# Patient Record
Sex: Female | Born: 1945
Health system: Southern US, Community
[De-identification: ages and names within clinical notes are randomized; demographics above are authoritative.]

## PROBLEM LIST (undated history)

## (undated) DIAGNOSIS — E7800 Pure hypercholesterolemia, unspecified: Secondary | ICD-10-CM

## (undated) DIAGNOSIS — I10 Essential (primary) hypertension: Secondary | ICD-10-CM

## (undated) DIAGNOSIS — F32A Depression, unspecified: Secondary | ICD-10-CM

## (undated) DIAGNOSIS — F329 Major depressive disorder, single episode, unspecified: Secondary | ICD-10-CM

## (undated) HISTORY — PX: BACK SURGERY: SHX140

## (undated) HISTORY — PX: REPLACEMENT TOTAL KNEE: SUR1224

---
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
## 1997-08-13 ENCOUNTER — Ambulatory Visit (HOSPITAL_COMMUNITY): Admission: RE | Admit: 1997-08-13 | Discharge: 1997-08-13 | Payer: Self-pay | Admitting: Orthopedic Surgery

## 1997-10-31 ENCOUNTER — Emergency Department (HOSPITAL_COMMUNITY): Admission: EM | Admit: 1997-10-31 | Discharge: 1997-10-31 | Payer: Self-pay | Admitting: Emergency Medicine

## 2001-02-25 ENCOUNTER — Ambulatory Visit (HOSPITAL_BASED_OUTPATIENT_CLINIC_OR_DEPARTMENT_OTHER): Admission: RE | Admit: 2001-02-25 | Discharge: 2001-02-25 | Payer: Self-pay | Admitting: Orthopedic Surgery

## 2001-04-19 ENCOUNTER — Ambulatory Visit (HOSPITAL_COMMUNITY): Admission: RE | Admit: 2001-04-19 | Discharge: 2001-04-19 | Payer: Self-pay | Admitting: Orthopedic Surgery

## 2003-09-12 ENCOUNTER — Emergency Department (HOSPITAL_COMMUNITY): Admission: EM | Admit: 2003-09-12 | Discharge: 2003-09-12 | Payer: Self-pay | Admitting: Emergency Medicine

## 2003-10-17 ENCOUNTER — Emergency Department (HOSPITAL_COMMUNITY): Admission: EM | Admit: 2003-10-17 | Discharge: 2003-10-17 | Payer: Self-pay | Admitting: Emergency Medicine

## 2003-11-06 ENCOUNTER — Emergency Department (HOSPITAL_COMMUNITY): Admission: EM | Admit: 2003-11-06 | Discharge: 2003-11-07 | Payer: Self-pay | Admitting: Emergency Medicine

## 2003-12-13 ENCOUNTER — Emergency Department (HOSPITAL_COMMUNITY): Admission: EM | Admit: 2003-12-13 | Discharge: 2003-12-13 | Payer: Self-pay | Admitting: Emergency Medicine

## 2005-08-10 ENCOUNTER — Emergency Department (HOSPITAL_COMMUNITY): Admission: EM | Admit: 2005-08-10 | Discharge: 2005-08-10 | Payer: Self-pay | Admitting: Emergency Medicine

## 2018-10-15 ENCOUNTER — Emergency Department (HOSPITAL_BASED_OUTPATIENT_CLINIC_OR_DEPARTMENT_OTHER): Payer: Medicare HMO

## 2018-10-15 ENCOUNTER — Other Ambulatory Visit: Payer: Self-pay

## 2018-10-15 ENCOUNTER — Emergency Department (HOSPITAL_BASED_OUTPATIENT_CLINIC_OR_DEPARTMENT_OTHER)
Admission: EM | Admit: 2018-10-15 | Discharge: 2018-10-15 | Disposition: A | Discharge status: Home / Self Care | Payer: Medicare HMO | Attending: Emergency Medicine | Admitting: Emergency Medicine

## 2018-10-15 ENCOUNTER — Encounter (HOSPITAL_BASED_OUTPATIENT_CLINIC_OR_DEPARTMENT_OTHER): Payer: Self-pay | Admitting: Emergency Medicine

## 2018-10-15 DIAGNOSIS — I10 Essential (primary) hypertension: Secondary | ICD-10-CM | POA: Diagnosis not present

## 2018-10-15 DIAGNOSIS — E785 Hyperlipidemia, unspecified: Secondary | ICD-10-CM | POA: Diagnosis not present

## 2018-10-15 DIAGNOSIS — W010XXA Fall on same level from slipping, tripping and stumbling without subsequent striking against object, initial encounter: Secondary | ICD-10-CM | POA: Insufficient documentation

## 2018-10-15 DIAGNOSIS — Y9389 Activity, other specified: Secondary | ICD-10-CM | POA: Diagnosis not present

## 2018-10-15 DIAGNOSIS — Y92810 Car as the place of occurrence of the external cause: Secondary | ICD-10-CM | POA: Diagnosis not present

## 2018-10-15 DIAGNOSIS — Y999 Unspecified external cause status: Secondary | ICD-10-CM | POA: Insufficient documentation

## 2018-10-15 DIAGNOSIS — Z720 Tobacco use: Secondary | ICD-10-CM | POA: Diagnosis not present

## 2018-10-15 DIAGNOSIS — S82092A Other fracture of left patella, initial encounter for closed fracture: Secondary | ICD-10-CM | POA: Insufficient documentation

## 2018-10-15 DIAGNOSIS — S8992XA Unspecified injury of left lower leg, initial encounter: Secondary | ICD-10-CM | POA: Diagnosis present

## 2018-10-15 HISTORY — DX: Depression, unspecified: F32.A

## 2018-10-15 HISTORY — DX: Essential (primary) hypertension: I10

## 2018-10-15 HISTORY — DX: Pure hypercholesterolemia, unspecified: E78.00

## 2018-10-15 MED ORDER — HYDROMORPHONE HCL 1 MG/ML IJ SOLN
1.0000 mg | Freq: Once | INTRAMUSCULAR | Status: AC
  Administered 2018-10-15: 1 mg via INTRAMUSCULAR
  Filled 2018-10-15: qty 1

## 2018-10-15 MED ORDER — MORPHINE SULFATE (PF) 4 MG/ML IV SOLN
4.0000 mg | Freq: Once | INTRAVENOUS | Status: AC
  Administered 2018-10-15: 4 mg via INTRAMUSCULAR
  Filled 2018-10-15: qty 1

## 2018-10-15 MED ORDER — HYDROMORPHONE HCL 1 MG/ML IJ SOLN
0.5000 mg | Freq: Once | INTRAMUSCULAR | Status: AC
  Administered 2018-10-15: 0.5 mg via INTRAMUSCULAR
  Filled 2018-10-15: qty 1

## 2018-10-15 MED ORDER — ONDANSETRON 4 MG PO TBDP
4.0000 mg | ORAL_TABLET | Freq: Once | ORAL | Status: AC
  Administered 2018-10-15: 12:00:00 4 mg via ORAL
  Filled 2018-10-15: qty 1

## 2018-10-15 MED ORDER — OXYCODONE-ACETAMINOPHEN 5-325 MG PO TABS: 1.0000 | ORAL_TABLET | ORAL | 0 refills | Status: DC | PRN

## 2018-10-15 MED FILL — OXYCODONE-ACETAMINOPHEN 5-3: 5-325 | 3 days supply | Qty: 15 | Fill #0

## 2018-10-15 NOTE — ED Triage Notes (Signed)
Tripped and fell last evening getting out of the car.  Injury to left knee.  Pain radiating to upper and lower leg. Not able to bear weight.  No other injury from the fall.

## 2018-10-15 NOTE — ED Notes (Signed)
Walked Pt. With walker but Pt. Unable to walk one step.  Pt. Will be given script for walker.

## 2018-10-15 NOTE — ED Provider Notes (Signed)
[facility] EMERGENCY DEPARTMENT Provider Note   CSN: [id number] Arrival date & time: 10/15/18  1131     History   Chief Complaint Chief Complaint  Patient presents with  . Knee Injury    HPI Kirsten Watson is a 73 y.o. female.     Pt presents to the ED today with left knee pain.  She said she fell last evening trying to get out of the car.  She landed on her knee.  Her leg below and above her knee also hurts, but no other injuries.  She did not hit her head or have a loc.  She has not been able to ambulate on it.     Past Medical History:  Diagnosis Date  . Depression   . High cholesterol   . Hypertension     There are no active problems to display for this patient.   Past Surgical History:  Procedure Laterality Date  . BACK SURGERY    . REPLACEMENT TOTAL KNEE       OB History   No obstetric history on file.      Home Medications    Prior to Admission medications   Medication Sig Start Date End Date Taking? Authorizing Provider  oxyCODONE-acetaminophen (PERCOCET/ROXICET) 5-325 MG tablet Take 1 tablet by mouth every 4 (four) hours as needed for severe pain. 10/15/18   Jacalyn Lefevre, MD    Family History No family history on file.  Social History Social History   Tobacco Use  . Smoking status: Current Some Day Smoker  . Smokeless tobacco: Never Used  Substance Use Topics  . Alcohol use: Never    Frequency: Never  . Drug use: Never     Allergies   Tramadol   Review of Systems Review of Systems  Musculoskeletal:       Left knee pain  All other systems reviewed and are negative.    Physical Exam Updated Vital Signs BP (!) 96/58 (BP Location: Right Arm)   Pulse 73   Temp 98.4 F (36.9 C) (Oral)   Resp 16   Ht 5' 9.5" (1.765 m)   Wt 79.3 kg   SpO2 95%   BMI 25.46 kg/m   Physical Exam Vitals signs and nursing note reviewed.  Constitutional:      Appearance: Normal appearance.  HENT:     Head: Normocephalic and  atraumatic.     Right Ear: External ear normal.     Left Ear: External ear normal.     Nose: Nose normal.     Mouth/Throat:     Mouth: Mucous membranes are moist.     Pharynx: Oropharynx is clear.  Eyes:     Extraocular Movements: Extraocular movements intact.     Conjunctiva/sclera: Conjunctivae normal.     Pupils: Pupils are equal, round, and reactive to light.  Neck:     Musculoskeletal: Normal range of motion and neck supple.  Cardiovascular:     Rate and Rhythm: Normal rate and regular rhythm.     Pulses: Normal pulses.     Heart sounds: Normal heart sounds.  Pulmonary:     Effort: Pulmonary effort is normal.     Breath sounds: Normal breath sounds.  Abdominal:     General: Abdomen is flat. Bowel sounds are normal.     Palpations: Abdomen is soft.  Musculoskeletal:     Left knee: She exhibits decreased range of motion, swelling and effusion.  Skin:    General: Skin is warm.  Capillary Refill: Capillary refill takes less than 2 seconds.  Neurological:     General: No focal deficit present.     Mental Status: She is alert and oriented to person, place, and time.  Psychiatric:        Mood and Affect: Mood normal.        Behavior: Behavior normal.      ED Treatments / Results  Labs (all labs ordered are listed, but only abnormal results are displayed) Labs Reviewed - No data to display  EKG None  Radiology Dg Tibia/fibula Left  Result Date: 10/15/2018 CLINICAL DATA:  Fall last evening getting out of a car, injury to LEFT knee, pain radiating to upper and lower leg, unable to bear weight EXAM: LEFT TIBIA AND FIBULA - 2 VIEW COMPARISON:  None FINDINGS: Knee imaged and reported separately. Osseous demineralization. Obliquity at ankle. No acute fracture, dislocation, or bone destruction. Scattered atherosclerotic calcifications. IMPRESSION: No acute osseous abnormalities. Please refer to LEFT knee radiographs, imaged and reported separately. Electronically Signed    By: Lavonia Dana M.D.   On: 10/15/2018 12:19   Ct Knee Left Wo Contrast  Result Date: 10/15/2018 CLINICAL DATA:  The patient suffered a left knee injury due to a trip and fall getting out of a car last night. Pain. Initial encounter. EXAM: CT OF THE LEFT KNEE WITHOUT CONTRAST TECHNIQUE: Multidetector CT imaging of the left knee was performed according to the standard protocol. Multiplanar CT image reconstructions were also generated. COMPARISON:  Plain films of the left knee today. FINDINGS: Bones/Joint/Cartilage The patient has a nondisplaced fracture of the patella. The fracture originates in the medial facet superiorly and extends in an anterior and inferior orientation through the inferior pole of the lateral facet. No other fracture is identified. Bones are osteopenic. Joint space narrowing and osteophytosis about the knee are worst in the lateral compartment. Hemarthrosis is identified. Ligaments Suboptimally assessed by CT. Muscles and Tendons Appear intact. Soft tissues None. IMPRESSION: Acute nondisplaced fracture of the patella as described above. No other acute bony abnormality is identified. Associated hemarthrosis noted. Osteoarthritis about the knee is worst in the lateral compartment. Electronically Signed   By: Inge Rise M.D.   On: 10/15/2018 13:26   Dg Knee Complete 4 Views Left  Result Date: 10/15/2018 CLINICAL DATA:  Fall. EXAM: LEFT KNEE - COMPLETE 4+ VIEW COMPARISON:  No recent prior. FINDINGS: Large knee joint effusion. Severe tricompartment degenerative change. Severe tricompartment degenerative changes most prominent about the lateral compartment. A small bony density posterior patella cannot be excluded. This could represent small fracture chips. This could represent small loose bodies. Similar finding noted along the anterior tibia. No evidence of dislocation. Peripheral vascular calcification. IMPRESSION: 1. Large knee joint effusion. Severe tricompartment degenerative  changes, most prominent about the lateral compartment. Tiny bony densities noted posterior to the patella and lung the anterior aspect of the tibia. These could represent small fracture chips. These could also represent small loose bodies. No other evidence of fracture. No dislocation. 2. Peripheral vascular disease. Electronically Signed   By: Marcello Moores  Register   On: 10/15/2018 12:18   Dg Femur Min 2 Views Left  Result Date: 10/15/2018 CLINICAL DATA:  Fall last evening getting out of a car, injury to LEFT knee, pain radiating to upper and lower leg, unable to bear weight EXAM: LEFT FEMUR 2 VIEWS COMPARISON:  None FINDINGS: Knee exclude, imaged and reported separately. Osseous demineralization. Hip joint space preserved. No acute fracture, dislocation or bone destruction.  LEFT knee joint effusion suspected. Scattered atherosclerotic calcifications. IMPRESSION: No acute osseous abnormalities. Suspected LEFT knee joint effusion. Knee reported separately. Electronically Signed   By: Ulyses SouthwardMark  Boles M.D.   On: 10/15/2018 12:17    Procedures Procedures (including critical care time)  Medications Ordered in ED Medications  HYDROmorphone (DILAUDID) injection 0.5 mg (has no administration in time range)  morphine 4 MG/ML injection 4 mg (4 mg Intramuscular Given 10/15/18 1153)  ondansetron (ZOFRAN-ODT) disintegrating tablet 4 mg (4 mg Oral Given 10/15/18 1152)  HYDROmorphone (DILAUDID) injection 1 mg (1 mg Intramuscular Given 10/15/18 1244)     Initial Impression / Assessment and Plan / ED Course  I have reviewed the triage vital signs and the nursing notes.  Pertinent labs & imaging results that were available during my care of the patient were reviewed by me and considered in my medical decision making (see chart for details).       Pt placed in a knee immobilizer.  She has a walker at home, but it does not have a functional brake.  I ordered another one for her to pick up at a medical supply store.  She  does have family that can help care for her at home.  She is instructed to f/u with ortho.  Return if worse.  Final Clinical Impressions(s) / ED Diagnoses   Final diagnoses:  Other closed fracture of left patella, initial encounter    ED Discharge Orders         Ordered    oxyCODONE-acetaminophen (PERCOCET/ROXICET) 5-325 MG tablet  Every 4 hours PRN     10/15/18 1359    For home use only DME 4 wheeled rolling walker with seat     10/15/18 1359           Jacalyn LefevreHaviland, Amey Hossain, MD 10/15/18 1401

## 2018-10-15 NOTE — ED Notes (Signed)
ED Provider at bedside. 

## 2018-10-26 ENCOUNTER — Emergency Department (HOSPITAL_BASED_OUTPATIENT_CLINIC_OR_DEPARTMENT_OTHER)
Admission: EM | Admit: 2018-10-26 | Discharge: 2018-10-26 | Disposition: A | Discharge status: Home / Self Care | Payer: Medicare HMO | Attending: Emergency Medicine | Admitting: Emergency Medicine

## 2018-10-26 ENCOUNTER — Emergency Department (HOSPITAL_BASED_OUTPATIENT_CLINIC_OR_DEPARTMENT_OTHER): Payer: Medicare HMO

## 2018-10-26 ENCOUNTER — Other Ambulatory Visit: Payer: Self-pay

## 2018-10-26 ENCOUNTER — Encounter (HOSPITAL_BASED_OUTPATIENT_CLINIC_OR_DEPARTMENT_OTHER): Payer: Self-pay | Admitting: *Deleted

## 2018-10-26 DIAGNOSIS — S82092D Other fracture of left patella, subsequent encounter for closed fracture with routine healing: Secondary | ICD-10-CM | POA: Insufficient documentation

## 2018-10-26 DIAGNOSIS — Z79899 Other long term (current) drug therapy: Secondary | ICD-10-CM | POA: Diagnosis not present

## 2018-10-26 DIAGNOSIS — M7122 Synovial cyst of popliteal space [Baker], left knee: Secondary | ICD-10-CM | POA: Insufficient documentation

## 2018-10-26 DIAGNOSIS — I10 Essential (primary) hypertension: Secondary | ICD-10-CM | POA: Diagnosis not present

## 2018-10-26 DIAGNOSIS — M79662 Pain in left lower leg: Secondary | ICD-10-CM | POA: Insufficient documentation

## 2018-10-26 DIAGNOSIS — M545 Low back pain, unspecified: Secondary | ICD-10-CM

## 2018-10-26 DIAGNOSIS — M25562 Pain in left knee: Secondary | ICD-10-CM | POA: Diagnosis present

## 2018-10-26 DIAGNOSIS — M25462 Effusion, left knee: Secondary | ICD-10-CM

## 2018-10-26 DIAGNOSIS — X58XXXD Exposure to other specified factors, subsequent encounter: Secondary | ICD-10-CM | POA: Insufficient documentation

## 2018-10-26 DIAGNOSIS — F17210 Nicotine dependence, cigarettes, uncomplicated: Secondary | ICD-10-CM | POA: Insufficient documentation

## 2018-10-26 MED ORDER — OXYCODONE-ACETAMINOPHEN 5-325 MG PO TABS: 1.0000 | ORAL_TABLET | Freq: Four times a day (QID) | ORAL | 0 refills | Status: AC | PRN

## 2018-10-26 NOTE — ED Notes (Signed)
Educated on use of knee immobilizer per md request to use after swelling decreases.  Ace wrap applied.  Tolerated well.  Positive pulses noted distal to ace wrap.

## 2018-10-26 NOTE — Discharge Instructions (Addendum)
You do not have a blood clot in your leg today, but you do have a cyst on the back of your knee.  This should improve on its own, but your orthopedic doctor may end up recommending some treatment for it as well.  Take Percocet as prescribed, as needed for severe pain.  Use Ace wrap until you are able to tolerate knee immobilizer.  Elevate and use ice 3-4 times daily alternating 20 minutes on, 20 minutes off.  It is importantly follow-up with orthopedic doctor below.  Please return to the emergency department if you develop any new or worsening symptoms.  Do not drink alcohol, drive, operate machinery or participate in any other potentially dangerous activities while taking opiate pain medication as it may make you sleepy. Do not take this medication with any other sedating medications, either prescription or over-the-counter. If you were prescribed Percocet or Vicodin, do not take these with acetaminophen (Tylenol) as it is already contained within these medications and overdose of Tylenol is dangerous.   This medication is an opiate (or narcotic) pain medication and can be habit forming.  Use it as little as possible to achieve adequate pain control.  Do not use or use it with extreme caution if you have a history of opiate abuse or dependence. This medication is intended for your use only - do not give any to anyone else and keep it in a secure place where nobody else, especially children, have access to it. It will also cause or worsen constipation, so you may want to consider taking an over-the-counter stool softener while you are taking this medication.

## 2018-10-26 NOTE — ED Provider Notes (Signed)
[facility] EMERGENCY DEPARTMENT Provider Note   CSN: [id number] Arrival date & time: 10/26/18  1609     History   Chief Complaint Chief Complaint  Patient presents with  . Knee Pain    HPI Kirsten Watson is a 73 y.o. female with history of hypertension, hypercholesterolemia, depression who presents with left knee pain after being diagnosed with a patellar fracture on 10/15/2018.  Patient reports she has been trying to walk at home with a walker, but is fallen 4 times with it.  She has also not been wearing a knee immobilizer, as she states they never gave her one.  Patient reports some tingling down her leg since her first fall.  Patient reports new pain in her left thigh and left low back.  She has had continued pain in her left knee.  She reports she had a fever the other night up to 101, however this has not recurred.  She did not hit her head or lose consciousness when she fell.  She is not anticoagulated.     HPI  Past Medical History:  Diagnosis Date  . Depression   . High cholesterol   . Hypertension     There are no active problems to display for this patient.   Past Surgical History:  Procedure Laterality Date  . BACK SURGERY    . REPLACEMENT TOTAL KNEE       OB History   No obstetric history on file.      Home Medications    Prior to Admission medications   Medication Sig Start Date End Date Taking? Authorizing Provider  DULoxetine (CYMBALTA) 60 MG capsule Take by mouth daily. 08/05/18   [provider]  irbesartan-hydrochlorothiazide (AVALIDE) 150-12.5 MG tablet Take 1 tablet by mouth daily. 08/12/18   [provider]  NIFEdipine (ADALAT CC) 60 MG 24 hr tablet Take 60 mg by mouth daily. 08/05/18   [provider]  oxyCODONE-acetaminophen (PERCOCET/ROXICET) 5-325 MG tablet Take 1-2 tablets by mouth every 6 (six) hours as needed for severe pain. 10/26/18   Rasheen Bells, Waylan Boga, PA-C  pravastatin (PRAVACHOL) 20 MG tablet TAKE 1  TABLET BY MOUTH EVERYDAY AT BEDTIME 09/21/18   [provider]  VENTOLIN HFA 108 (90 Base) MCG/ACT inhaler INHALE 2 PUFFS BY INHALATION ROUTE 2 TIMES A DAY FOR 90 DAYS 06/09/18   [provider]    Family History No family history on file.  Social History Social History   Tobacco Use  . Smoking status: Current Some Day Smoker    Types: Cigarettes  . Smokeless tobacco: Never Used  Substance Use Topics  . Alcohol use: Never    Frequency: Never  . Drug use: Never     Allergies   Tramadol   Review of Systems Review of Systems  Constitutional: Negative for chills and fever.  HENT: Negative for facial swelling and sore throat.   Respiratory: Negative for shortness of breath.   Cardiovascular: Negative for chest pain.  Gastrointestinal: Negative for abdominal pain, nausea and vomiting.  Genitourinary: Negative for dysuria.  Musculoskeletal: Positive for arthralgias, joint swelling and myalgias. Negative for back pain.  Skin: Negative for rash and wound.  Neurological: Negative for syncope and headaches.  Psychiatric/Behavioral: The patient is not nervous/anxious.      Physical Exam Updated Vital Signs BP (!) 114/58 (BP Location: Right Arm)   Pulse 88   Temp 99.2 F (37.3 C) (Oral)   Resp 18   Ht 5' 9.5" (1.765 m)  Wt 72.6 kg   SpO2 97%   BMI 23.29 kg/m   Physical Exam Vitals signs and nursing note reviewed.  Constitutional:      General: She is not in acute distress.    Appearance: She is well-developed. She is not diaphoretic.  HENT:     Head: Normocephalic and atraumatic.     Mouth/Throat:     Pharynx: No oropharyngeal exudate.  Eyes:     General: No scleral icterus.       Right eye: No discharge.        Left eye: No discharge.     Conjunctiva/sclera: Conjunctivae normal.     Pupils: Pupils are equal, round, and reactive to light.  Neck:     Musculoskeletal: Normal range of motion and neck supple.     Thyroid: No thyromegaly.   Cardiovascular:     Rate and Rhythm: Normal rate and regular rhythm.     Heart sounds: Normal heart sounds. No murmur. No friction rub. No gallop.   Pulmonary:     Effort: Pulmonary effort is normal. No respiratory distress.     Breath sounds: Normal breath sounds. No stridor. No wheezing or rales.  Abdominal:     General: Bowel sounds are normal. There is no distension.     Palpations: Abdomen is soft.     Tenderness: There is no abdominal tenderness. There is no guarding or rebound.  Musculoskeletal:     Comments: Joint effusion and significant tenderness noted to the left knee, anteriorly; left medial calf pain and left lateral thigh pain Some tenderness to the left lumbar paraspinal muscles; area of swelling over the muscle; no midline tenderness  Lymphadenopathy:     Cervical: No cervical adenopathy.  Skin:    General: Skin is warm and dry.     Coloration: Skin is not pale.     Findings: No rash.  Neurological:     Mental Status: She is alert.     Coordination: Coordination normal.     Comments: Sensation intact throughout; DP pulses intact      ED Treatments / Results  Labs (all labs ordered are listed, but only abnormal results are displayed) Labs Reviewed - No data to display  EKG None  Radiology [place] Venous Img Lower Unilateral Left  Result Date: 10/26/2018 CLINICAL DATA:  Increased left knee and lower leg pain and swelling following multiple falls. The patient fell and fractured her knee on 10/15/2018. EXAM: LEFT LOWER EXTREMITY VENOUS DOPPLER ULTRASOUND TECHNIQUE: Gray-scale sonography with graded compression, as well as color Doppler and duplex ultrasound were performed to evaluate the lower extremity deep venous systems from the level of the common femoral vein and including the common femoral, femoral, profunda femoral, popliteal and calf veins including the posterior tibial, peroneal and gastrocnemius veins when visible. The superficial great saphenous vein was also  interrogated. Spectral Doppler was utilized to evaluate flow at rest and with distal augmentation maneuvers in the common femoral, femoral and popliteal veins. COMPARISON:  Left knee radiographs obtained earlier today. FINDINGS: Contralateral Common Femoral Vein: Respiratory phasicity is normal and symmetric with the symptomatic side. No evidence of thrombus. Normal compressibility. Common Femoral Vein: No evidence of thrombus. Normal compressibility, respiratory phasicity and response to augmentation. Saphenofemoral Junction: No evidence of thrombus. Normal compressibility and flow on color Doppler imaging. Profunda Femoral Vein: No evidence of thrombus. Normal compressibility and flow on color Doppler imaging. Femoral Vein: No evidence of thrombus. Normal compressibility, respiratory phasicity and response to augmentation. Popliteal  Vein: No evidence of thrombus. Normal compressibility, respiratory phasicity and response to augmentation. Calf Veins: No evidence of thrombus. Normal compressibility and flow on color Doppler imaging. Superficial Great Saphenous Vein: No evidence of thrombus. Normal compressibility. Venous Reflux:  None. Other Findings: Elongated fluid collection in the popliteal fossa, measuring 5.6 x 2.3 x 1.3 cm. IMPRESSION: 1. No evidence of deep venous thrombosis. 2. 5.6 x 2.3 x 1.3 cm popliteal cyst. Electronically Signed   By: Claudie Revering M.D.   On: 10/26/2018 18:17   Dg Knee Complete 4 Views Left  Result Date: 10/26/2018 CLINICAL DATA:  Hx left patellar fracture from accident 10/15/2018 multiple falls since, having increased left knee pain with swelling Images for AP and Lateral distal femur included in knee imaging EXAM: LEFT KNEE - COMPLETE 4+ VIEW COMPARISON:  Left knee radiographs 10/15/2018. Left knee CT 10/15/2018. FINDINGS: Persistent large knee joint effusion. The patient's known patellar fracture is poorly visualized radiographically. No new acute abnormality identified. There are  tricompartmental degenerative changes. IMPRESSION: No new acute abnormality identified in the left knee. Persistent large joint effusion in the setting of a known patellar fracture, not well seen radiographically. Electronically Signed   By: Audie Pinto M.D.   On: 10/26/2018 17:44   Dg Femur Min 2 Views Left  Result Date: 10/26/2018 CLINICAL DATA:  Hx left patellar fracture from accident 10/15/2018 multiple falls since, having increased left knee pain with swelling EXAM: LEFT FEMUR 2 VIEWS COMPARISON:  Left femur radiographs 10/15/2018 FINDINGS: The left hip appears in anatomic alignment. The hip joint space appears preserved. There is no evidence of fracture in the left femur. No focal bony lesion. Atherosclerotic calcification seen in the regional soft tissues. IMPRESSION: No acute osseous abnormality in the left femur. Electronically Signed   By: Audie Pinto M.D.   On: 10/26/2018 17:40    Procedures Procedures (including critical care time)  Medications Ordered in ED Medications - No data to display   Initial Impression / Assessment and Plan / ED Course  I have reviewed the triage vital signs and the nursing notes.  Pertinent labs & imaging results that were available during my care of the patient were reviewed by me and considered in my medical decision making (see chart for details).        Patient with ongoing left knee and thigh pain as well as some mild pain in the low back.  The swelling the low back I suspect is hematoma or calcified hematoma.  There is no bony tenderness indicating imaging today.  X-ray of the knee shows no new abnormality, but a persistent large joint effusion in the setting of the patellar fracture is seen.  DVT ultrasound is negative for DVT, but does show popliteal cyst.  Patient has significant effusion and do not feel knee immobilizer would be tolerable right now, but will send home with knee immobilizer and start with Ace wrap.  Elevation and ice  discussed.  Patient encouraged to follow-up with orthopedics as soon as possible.  Will refill pain medication for now.  Patient understands and agrees with plan.  Patient vitals stable throughout ED course and discharged in satisfactory condition.  Patient also evaluated by my attending, Dr. Johnney Killian, who guided the patient's management and agrees with plan.  Final Clinical Impressions(s) / ED Diagnoses   Final diagnoses:  Effusion of left knee  Acute left-sided low back pain without sciatica  Other closed fracture of left patella with routine healing, subsequent encounter  Synovial cyst  of left popliteal space    ED Discharge Orders         Ordered    oxyCODONE-acetaminophen (PERCOCET/ROXICET) 5-325 MG tablet  Every 6 hours PRN     10/26/18 1830           Emi HolesLaw, Haileigh Pitz M, PA-C 10/26/18 Nida [id number]    Arby BarrettePfeiffer, Marcy, MD 10/26/18 [phone]

## 2018-10-26 NOTE — ED Triage Notes (Signed)
Pt seen here on 8/25 for pain to left knee s/p fall. States she "broke her knee". She has been attempting to use a walker but has fallen several times since then. Here today for continued pain in left knee

## 2018-10-26 NOTE — ED Notes (Signed)
ED Provider at bedside. 

## 2018-10-26 NOTE — ED Notes (Signed)
Patient transported to Ultrasound/ Xray 

## 2020-09-28 ENCOUNTER — Encounter (HOSPITAL_BASED_OUTPATIENT_CLINIC_OR_DEPARTMENT_OTHER): Payer: Self-pay | Admitting: *Deleted

## 2020-09-28 ENCOUNTER — Emergency Department (HOSPITAL_BASED_OUTPATIENT_CLINIC_OR_DEPARTMENT_OTHER): Payer: Medicare Other

## 2020-09-28 ENCOUNTER — Other Ambulatory Visit: Payer: Self-pay

## 2020-09-28 ENCOUNTER — Emergency Department (HOSPITAL_BASED_OUTPATIENT_CLINIC_OR_DEPARTMENT_OTHER)
Admission: EM | Admit: 2020-09-28 | Discharge: 2020-09-28 | Disposition: A | Discharge status: Home / Self Care | Payer: Medicare Other | Attending: Emergency Medicine | Admitting: Emergency Medicine

## 2020-09-28 DIAGNOSIS — Z20822 Contact with and (suspected) exposure to covid-19: Secondary | ICD-10-CM | POA: Diagnosis not present

## 2020-09-28 DIAGNOSIS — I10 Essential (primary) hypertension: Secondary | ICD-10-CM | POA: Diagnosis not present

## 2020-09-28 DIAGNOSIS — Z79899 Other long term (current) drug therapy: Secondary | ICD-10-CM | POA: Insufficient documentation

## 2020-09-28 DIAGNOSIS — R519 Headache, unspecified: Secondary | ICD-10-CM | POA: Insufficient documentation

## 2020-09-28 DIAGNOSIS — F17210 Nicotine dependence, cigarettes, uncomplicated: Secondary | ICD-10-CM | POA: Diagnosis not present

## 2020-09-28 LAB — CBC
HCT: 39 % (ref 36.0–46.0)
Hemoglobin: 11.6 g/dL — ABNORMAL LOW (ref 12.0–15.0)
MCH: 21.5 pg — ABNORMAL LOW (ref 26.0–34.0)
MCHC: 29.7 g/dL — ABNORMAL LOW (ref 30.0–36.0)
MCV: 72.4 fL — ABNORMAL LOW (ref 80.0–100.0)
Platelets: 488 10*3/uL — ABNORMAL HIGH (ref 150–400)
RBC: 5.39 MIL/uL — ABNORMAL HIGH (ref 3.87–5.11)
RDW: 21.8 % — ABNORMAL HIGH (ref 11.5–15.5)
WBC: 10.6 10*3/uL — ABNORMAL HIGH (ref 4.0–10.5)
nRBC: 0 % (ref 0.0–0.2)

## 2020-09-28 LAB — BASIC METABOLIC PANEL
Anion gap: 10 (ref 5–15)
BUN: 22 mg/dL (ref 8–23)
CO2: 26 mmol/L (ref 22–32)
Calcium: 9.7 mg/dL (ref 8.9–10.3)
Chloride: 104 mmol/L (ref 98–111)
Creatinine, Ser: 1.04 mg/dL — ABNORMAL HIGH (ref 0.44–1.00)
GFR, Estimated: 56 mL/min — ABNORMAL LOW (ref >60)
Glucose, Bld: 85 mg/dL (ref 70–99)
Potassium: 4 mmol/L (ref 3.5–5.1)
Sodium: 140 mmol/L (ref 135–145)

## 2020-09-28 MED ORDER — METOCLOPRAMIDE HCL 5 MG/ML IJ SOLN
10.0000 mg | Freq: Once | INTRAMUSCULAR | Status: AC
  Administered 2020-09-28: 10 mg via INTRAVENOUS
  Filled 2020-09-28: qty 2

## 2020-09-28 MED ORDER — SODIUM CHLORIDE 0.9 % IV BOLUS
1000.0000 mL | Freq: Once | INTRAVENOUS | Status: AC
  Administered 2020-09-28: 1000 mL via INTRAVENOUS

## 2020-09-28 MED ORDER — DIPHENHYDRAMINE HCL 50 MG/ML IJ SOLN
12.5000 mg | Freq: Once | INTRAMUSCULAR | Status: AC
  Administered 2020-09-28: 12.5 mg via INTRAVENOUS
  Filled 2020-09-28: qty 1

## 2020-09-28 MED ORDER — KETOROLAC TROMETHAMINE 15 MG/ML IJ SOLN
15.0000 mg | Freq: Once | INTRAMUSCULAR | Status: AC
  Administered 2020-09-28: 15 mg via INTRAVENOUS
  Filled 2020-09-28: qty 1

## 2020-09-28 MED ORDER — SALINE SPRAY 0.65 % NA SOLN
1.0000 | Freq: Once | NASAL | Status: AC
  Administered 2020-09-28: 1 via NASAL
  Filled 2020-09-28: qty 44

## 2020-09-28 MED ORDER — SODIUM CHLORIDE 0.9 % IV SOLN: INTRAVENOUS | Status: DC

## 2020-09-28 MED ORDER — MORPHINE SULFATE (PF) 4 MG/ML IV SOLN
4.0000 mg | Freq: Once | INTRAVENOUS | Status: AC
  Administered 2020-09-28: 4 mg via INTRAVENOUS
  Filled 2020-09-28: qty 1

## 2020-09-28 MED ORDER — HYDROCODONE-ACETAMINOPHEN 5-325 MG PO TABS
1.0000 | ORAL_TABLET | ORAL | 0 refills | Status: AC | PRN
Start: 2020-09-28 — End: ?

## 2020-09-28 NOTE — ED Notes (Signed)
Patient transported to CT 

## 2020-09-28 NOTE — ED Provider Notes (Signed)
[facility] EMERGENCY DEPARTMENT Provider Note   CSN: [id number] Arrival date & time: 09/28/20  1708     History Chief Complaint  Patient presents with   Hypertension    Kirsten Watson is a 75 y.o. female.  Pt presents to the ED today with a headache.  She said she has had a headache all day and it feels like prior migraines.  She said her bp was elevated this am and she had a nose bleed this morning.  She does have a hx of htn and has been compliant with her meds.      Past Medical History:  Diagnosis Date   Depression    High cholesterol    Hypertension     There are no problems to display for this patient.   Past Surgical History:  Procedure Laterality Date   BACK SURGERY     REPLACEMENT TOTAL KNEE       OB History   No obstetric history on file.     No family history on file.  Social History   Tobacco Use   Smoking status: Some Days    Types: Cigarettes   Smokeless tobacco: Never  Vaping Use   Vaping Use: Never used  Substance Use Topics   Alcohol use: Never   Drug use: Never    Home Medications Prior to Admission medications   Medication Sig Start Date End Date Taking? Authorizing Provider  DULoxetine (CYMBALTA) 60 MG capsule Take by mouth daily. 08/05/18  Yes [provider]  HYDROcodone-acetaminophen (NORCO/VICODIN) 5-325 MG tablet Take 1 tablet by mouth every 4 (four) hours as needed. 09/28/20  Yes Jacalyn Lefevre, MD  irbesartan-hydrochlorothiazide (AVALIDE) 150-12.5 MG tablet Take 1 tablet by mouth daily. 08/12/18  Yes [provider]  NIFEdipine (ADALAT CC) 60 MG 24 hr tablet Take 60 mg by mouth daily. 08/05/18  Yes [provider]  pravastatin (PRAVACHOL) 20 MG tablet TAKE 1 TABLET BY MOUTH EVERYDAY AT BEDTIME 09/21/18  Yes [provider]  VENTOLIN HFA 108 (90 Base) MCG/ACT inhaler INHALE 2 PUFFS BY INHALATION ROUTE 2 TIMES A DAY FOR 90 DAYS 06/09/18  Yes [provider]   oxyCODONE-acetaminophen (PERCOCET/ROXICET) 5-325 MG tablet Take 1-2 tablets by mouth every 6 (six) hours as needed for severe pain. 10/26/18   Emi Holes, PA-C    Allergies    Tramadol  Review of Systems   Review of Systems  HENT:  Positive for nosebleeds.   Neurological:  Positive for headaches.  All other systems reviewed and are negative.  Physical Exam Updated Vital Signs BP (!) 148/79 (BP Location: Left Arm)   Pulse 73   Temp 98.7 F (37.1 C) (Oral)   Resp 16   Ht 5' 9.5" (1.765 m)   Wt 72.6 kg   SpO2 99%   BMI 23.29 kg/m   Physical Exam Vitals and nursing note reviewed.  Constitutional:      Appearance: Normal appearance.  HENT:     Head: Normocephalic and atraumatic.     Right Ear: External ear normal.     Left Ear: External ear normal.     Nose: Nose normal.     Comments: No active bleeding.  No dried blood.  Mucosa is dry.    Mouth/Throat:     Mouth: Mucous membranes are moist.     Pharynx: Oropharynx is clear.  Eyes:     Extraocular Movements: Extraocular movements intact.     Conjunctiva/sclera: Conjunctivae normal.  Pupils: Pupils are equal, round, and reactive to light.  Cardiovascular:     Rate and Rhythm: Normal rate and regular rhythm.     Pulses: Normal pulses.     Heart sounds: Normal heart sounds.  Pulmonary:     Effort: Pulmonary effort is normal.     Breath sounds: Normal breath sounds.  Abdominal:     General: Abdomen is flat. Bowel sounds are normal.     Palpations: Abdomen is soft.  Musculoskeletal:        General: Normal range of motion.     Cervical back: Normal range of motion and neck supple.  Skin:    General: Skin is warm.     Capillary Refill: Capillary refill takes less than 2 seconds.  Neurological:     General: No focal deficit present.     Mental Status: She is alert and oriented to person, place, and time.  Psychiatric:        Mood and Affect: Mood normal.        Behavior: Behavior normal.        Thought  Content: Thought content normal.        Judgment: Judgment normal.    ED Results / Procedures / Treatments   Labs (all labs ordered are listed, but only abnormal results are displayed) Labs Reviewed  CBC - Abnormal; Notable for the following components:      Result Value   WBC 10.6 (*)    RBC 5.39 (*)    Hemoglobin 11.6 (*)    MCV 72.4 (*)    MCH 21.5 (*)    MCHC 29.7 (*)    RDW 21.8 (*)    Platelets 488 (*)    All other components within normal limits  BASIC METABOLIC PANEL - Abnormal; Notable for the following components:   Creatinine, Ser 1.04 (*)    GFR, Estimated 56 (*)    All other components within normal limits  SARS CORONAVIRUS 2 (TAT 6-24 HRS)    EKG None  Radiology CT HEAD WO CONTRAST  Result Date: 09/28/2020 CLINICAL DATA:  New onset headaches, initial encounter EXAM: CT HEAD WITHOUT CONTRAST TECHNIQUE: Contiguous axial images were obtained from the base of the skull through the vertex without intravenous contrast. COMPARISON:  None. FINDINGS: Brain: No evidence of acute infarction, hemorrhage, hydrocephalus, extra-axial collection or mass lesion/mass effect. Chronic atrophic and ischemic changes are noted. Vascular: No hyperdense vessel or unexpected calcification. Skull: Normal. Negative for fracture or focal lesion. Sinuses/Orbits: No acute finding. Other: None. IMPRESSION: Chronic atrophic and ischemic changes without acute intracranial abnormality. Electronically Signed   By: Alcide Clever M.D.   On: 09/28/2020 21:12    Procedures Procedures   Medications Ordered in ED Medications  sodium chloride 0.9 % bolus 1,000 mL (0 mLs Intravenous Stopped 09/28/20 2235)    And  0.9 %  sodium chloride infusion ( Intravenous New Bag/Given 09/28/20 2235)  ketorolac (TORADOL) 15 MG/ML injection 15 mg (15 mg Intravenous Given 09/28/20 2130)  metoCLOPramide (REGLAN) injection 10 mg (10 mg Intravenous Given 09/28/20 2133)  diphenhydrAMINE (BENADRYL) injection 12.5 mg (12.5 mg  Intravenous Given 09/28/20 2134)  sodium chloride (OCEAN) 0.65 % nasal spray 1 spray (1 spray Each Nare Given 09/28/20 2136)  morphine 4 MG/ML injection 4 mg (4 mg Intravenous Given 09/28/20 2221)    ED Course  I have reviewed the triage vital signs and the nursing notes.  Pertinent labs & imaging results that were available during my care of the  patient were reviewed by me and considered in my medical decision making (see chart for details).    MDM Rules/Calculators/A&P                           Pt's CT is neg for acute.   Pt is feeling better after fluids and meds.  Covid swab pending.  Pt is stable for d/c.  Return if worse.  F/u with pcp. Final Clinical Impression(s) / ED Diagnoses Final diagnoses:  Acute nonintractable headache, unspecified headache type    Rx / DC Orders ED Discharge Orders          Ordered    HYDROcodone-acetaminophen (NORCO/VICODIN) 5-325 MG tablet  Every 4 hours PRN        09/28/20 2239             Jacalyn Lefevre, MD 09/28/20 2240

## 2020-09-28 NOTE — ED Triage Notes (Signed)
She woke with a nosebleed. Her daughter has been taking her BP all day and it has been elevated. She called her MD and was told to come to the ER. Her BP medications were recently changed.

## 2020-09-28 NOTE — ED Notes (Signed)
Pt. Reports she still has a headache

## 2020-09-28 NOTE — ED Notes (Signed)
Pt. Reports she has a headache and it feels like the last migraine headache she had.

## 2020-09-29 LAB — SARS CORONAVIRUS 2 (TAT 6-24 HRS): SARS Coronavirus 2: NEGATIVE

## 2021-04-22 IMAGING — US US EXTREM LOW VENOUS*L*
1 series · 13 of 24 positions shown · non-contrast
Comparison: Left knee radiographs obtained earlier today.

CLINICAL DATA: Increased left knee and lower leg pain and swelling
following multiple falls. The patient fell and fractured her knee on
10/15/2018.



[Series 1: us extrem low venous*left* · 13 of 39 slices shown]
[im 1/39]
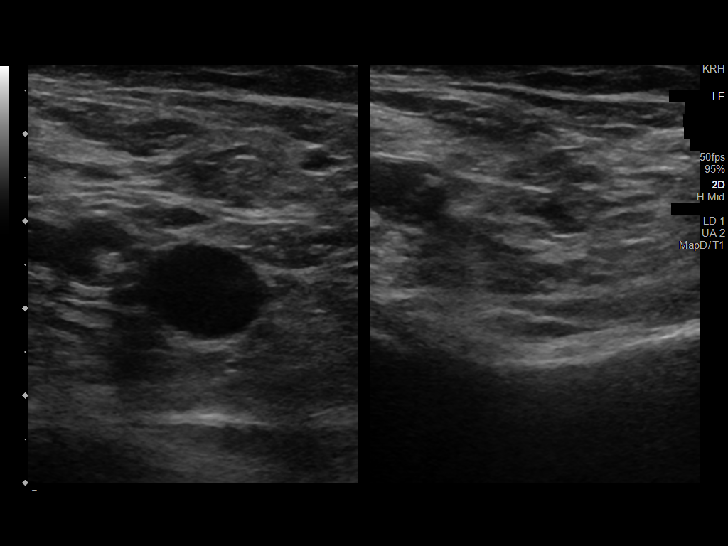
[im 4/39]
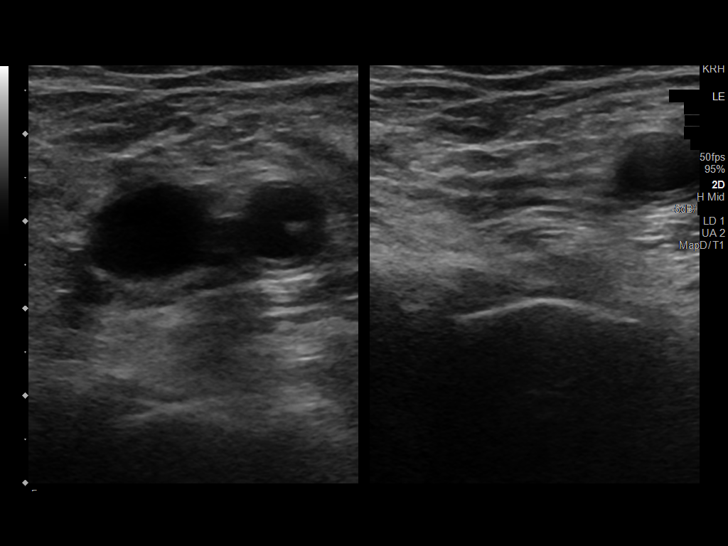
[im 7/39]
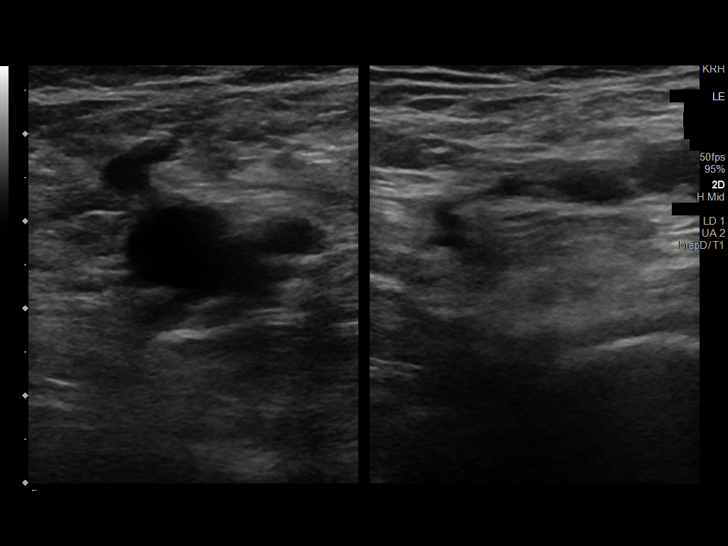
[im 10/39]
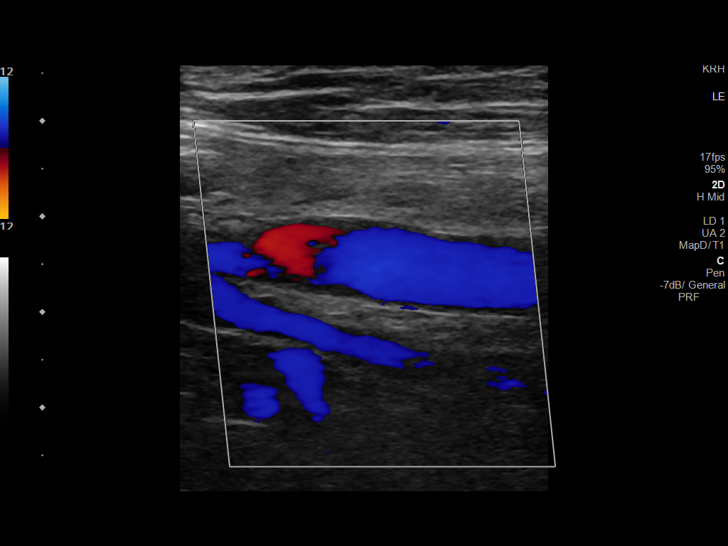
[im 14/39]
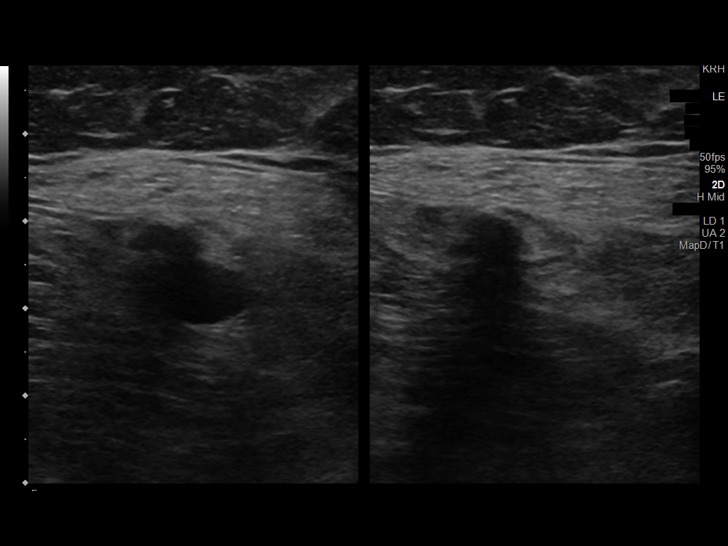
[im 17/39]
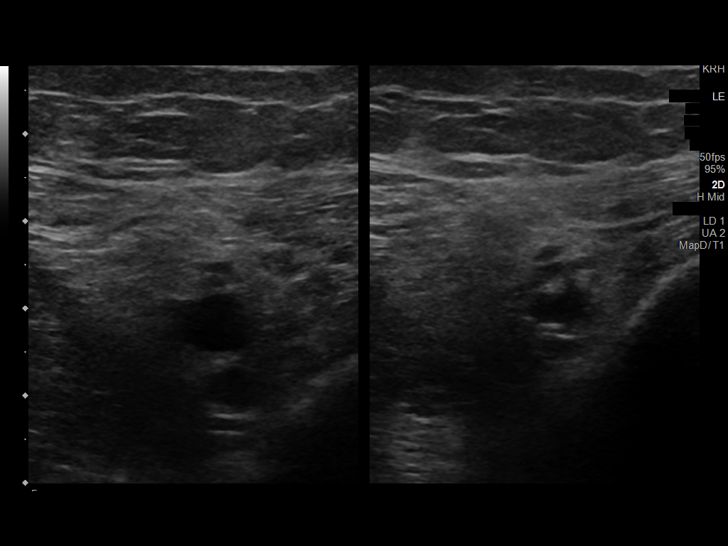
[im 20/39]
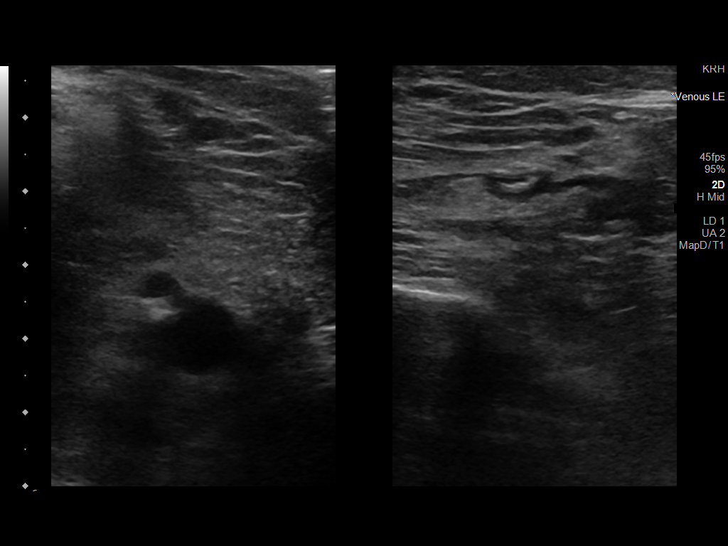
[im 22/39]
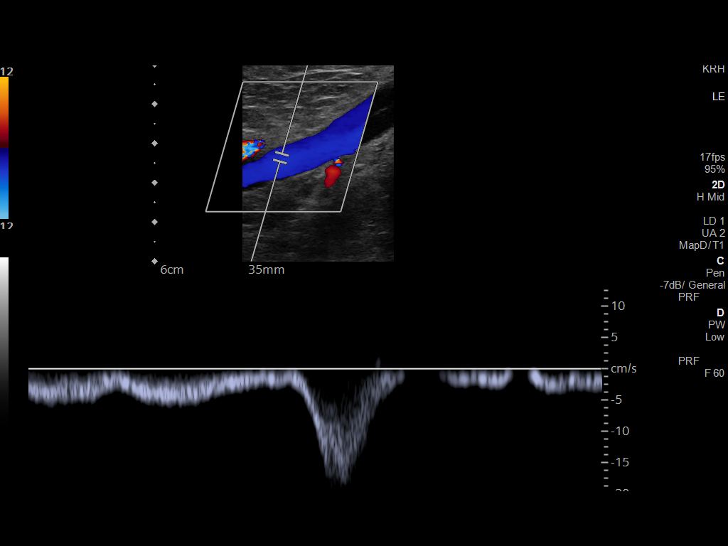
[im 25/39]
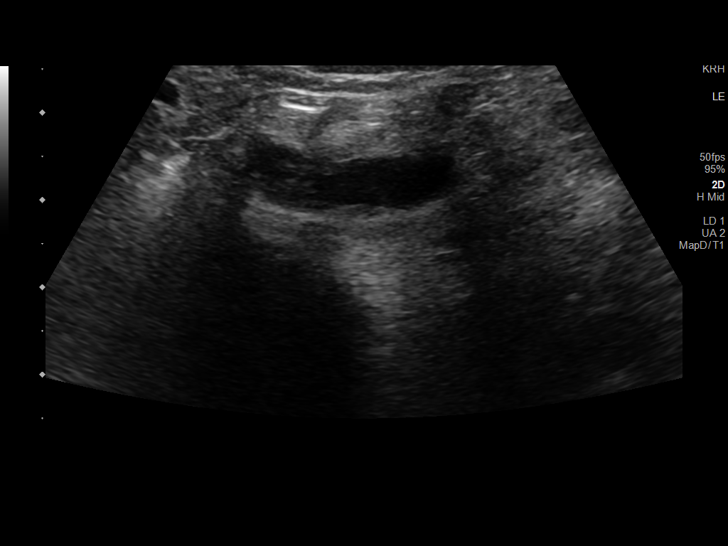
[im 29/39]
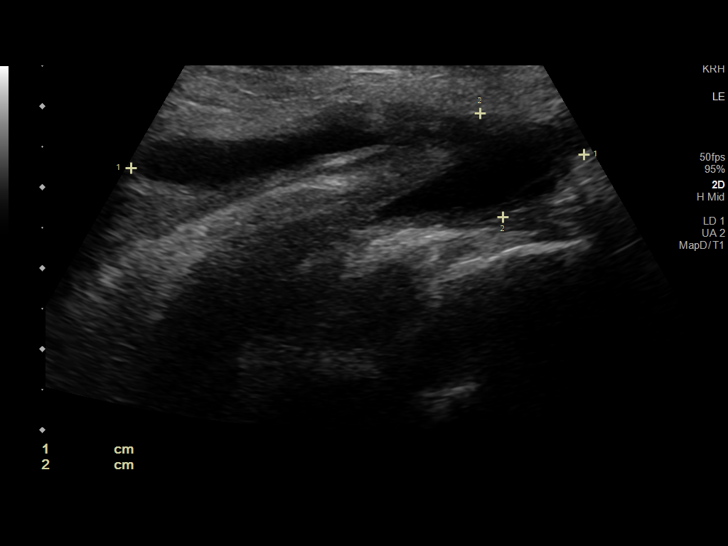
[im 32/39]
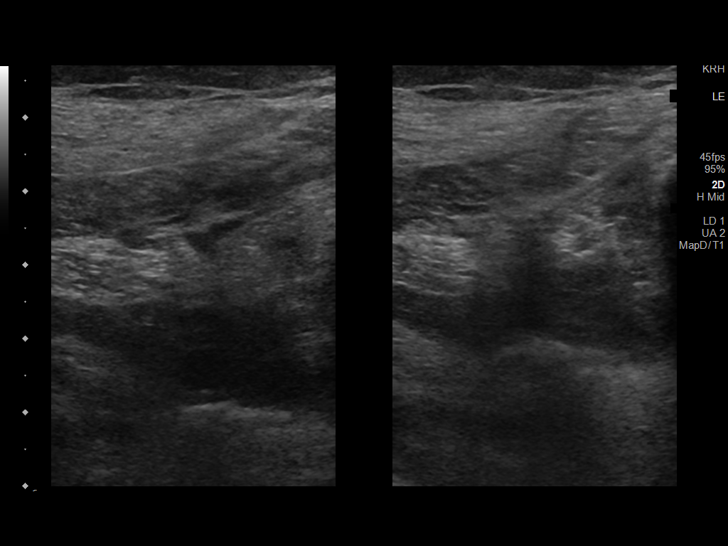
[im 35/39]
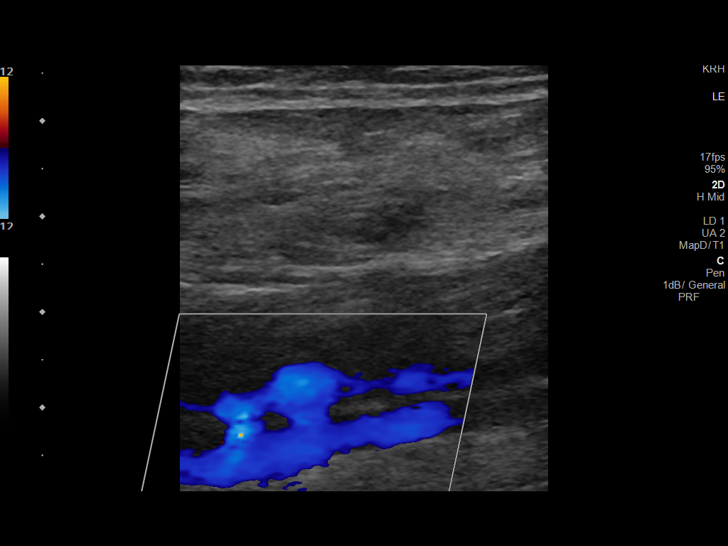
[im 39/39]
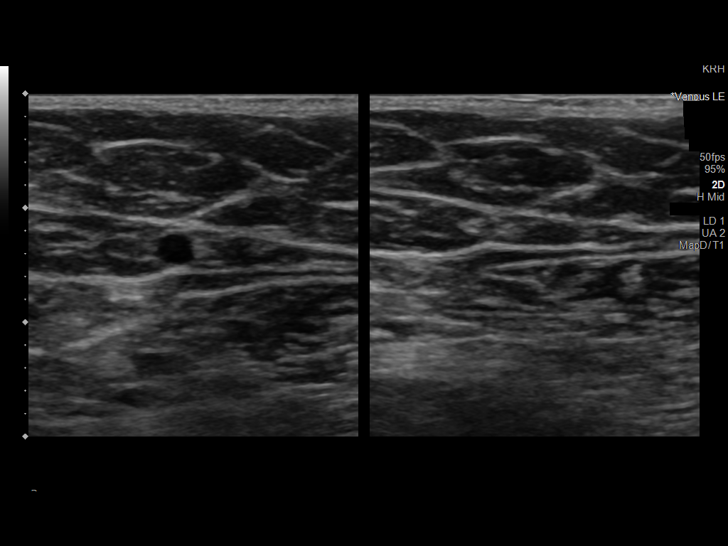

[13 of 24 positions shown; findings below may reference images not displayed]

FINDINGS: Contralateral Common Femoral Vein: Respiratory phasicity is normal
and symmetric with the symptomatic side. No evidence of thrombus.
Normal compressibility.

Common Femoral Vein: No evidence of thrombus. Normal
compressibility, respiratory phasicity and response to augmentation.

Saphenofemoral Junction: No evidence of thrombus. Normal
compressibility and flow on color Doppler imaging.

Profunda Femoral Vein: No evidence of thrombus. Normal
compressibility and flow on color Doppler imaging.

Femoral Vein: No evidence of thrombus. Normal compressibility,
respiratory phasicity and response to augmentation.

Popliteal Vein: No evidence of thrombus. Normal compressibility,
respiratory phasicity and response to augmentation.

Calf Veins: No evidence of thrombus. Normal compressibility and flow
on color Doppler imaging.

Superficial Great Saphenous Vein: No evidence of thrombus. Normal
compressibility.

Venous Reflux:  None.

Other Findings: Elongated fluid collection in the popliteal fossa,
measuring 5.6 x 2.3 x 1.3 cm.
IMPRESSION: 1. No evidence of deep venous thrombosis.
2. 5.6 x 2.3 x 1.3 cm popliteal cyst.

## 2023-11-27 ENCOUNTER — Emergency Department (HOSPITAL_BASED_OUTPATIENT_CLINIC_OR_DEPARTMENT_OTHER)
Admission: EM | Admit: 2023-11-27 | Discharge: 2023-11-27 | Discharge status: Against Medical Advice | Attending: Emergency Medicine | Admitting: Emergency Medicine

## 2023-11-27 ENCOUNTER — Encounter (HOSPITAL_BASED_OUTPATIENT_CLINIC_OR_DEPARTMENT_OTHER): Payer: Self-pay

## 2023-11-27 ENCOUNTER — Other Ambulatory Visit: Payer: Self-pay

## 2023-11-27 DIAGNOSIS — Z5321 Procedure and treatment not carried out due to patient leaving prior to being seen by health care provider: Secondary | ICD-10-CM | POA: Diagnosis not present

## 2023-11-27 DIAGNOSIS — R220 Localized swelling, mass and lump, head: Secondary | ICD-10-CM | POA: Insufficient documentation

## 2023-11-27 DIAGNOSIS — H538 Other visual disturbances: Secondary | ICD-10-CM | POA: Insufficient documentation

## 2023-11-27 LAB — BASIC METABOLIC PANEL WITH GFR
Anion gap: 12 (ref 5–15)
BUN: 12 mg/dL (ref 8–23)
CO2: 21 mmol/L — ABNORMAL LOW (ref 22–32)
Calcium: 9.5 mg/dL (ref 8.9–10.3)
Chloride: 108 mmol/L (ref 98–111)
Creatinine, Ser: 0.73 mg/dL (ref 0.44–1.00)
GFR, Estimated: >60 mL/min (ref >60)
Glucose, Bld: 113 mg/dL — ABNORMAL HIGH (ref 70–99)
Potassium: 4.1 mmol/L (ref 3.5–5.1)
Sodium: 141 mmol/L (ref 135–145)

## 2023-11-27 LAB — CBC WITH DIFFERENTIAL/PLATELET
Abs Immature Granulocytes: 0.02 K/uL (ref 0.00–0.07)
Basophils Absolute: 0 K/uL (ref 0.0–0.1)
Basophils Relative: 0 %
Eosinophils Absolute: 0.1 K/uL (ref 0.0–0.5)
Eosinophils Relative: 1 %
HCT: 43.9 % (ref 36.0–46.0)
Hemoglobin: 13.9 g/dL (ref 12.0–15.0)
Immature Granulocytes: 0 %
Lymphocytes Relative: 32 %
Lymphs Abs: 2.6 K/uL (ref 0.7–4.0)
MCH: 27.7 pg (ref 26.0–34.0)
MCHC: 31.7 g/dL (ref 30.0–36.0)
MCV: 87.6 fL (ref 80.0–100.0)
Monocytes Absolute: 0.4 K/uL (ref 0.1–1.0)
Monocytes Relative: 5 %
Neutro Abs: 4.9 K/uL (ref 1.7–7.7)
Neutrophils Relative %: 62 %
Platelets: 215 K/uL (ref 150–400)
RBC: 5.01 MIL/uL (ref 3.87–5.11)
RDW: 15.6 % — ABNORMAL HIGH (ref 11.5–15.5)
WBC: 8 K/uL (ref 4.0–10.5)
nRBC: 0 % (ref 0.0–0.2)

## 2023-11-27 NOTE — ED Triage Notes (Signed)
 States on Friday woke up with bilateral eye swelling and went away. Woke up yesterday with right sided eye swelling. Denies pain, drainage. Has some blurred vision. Red bump noted to inner upper eyelid.  Denies injury.

## 2023-11-27 NOTE — ED Notes (Signed)
 Called pt x 2 in lobby, no answer.
